# Patient Record
Sex: Female | Born: 1975 | Race: White | Hispanic: No | Marital: Married
Health system: Southern US, Community
[De-identification: ages and names within clinical notes are randomized; demographics above are authoritative.]

---

## 2000-01-17 ENCOUNTER — Other Ambulatory Visit: Admission: RE | Admit: 2000-01-17 | Discharge: 2000-01-17 | Payer: Self-pay | Admitting: *Deleted

## 2001-01-19 ENCOUNTER — Encounter: Payer: Self-pay | Admitting: Emergency Medicine

## 2001-01-19 ENCOUNTER — Emergency Department (HOSPITAL_COMMUNITY): Admission: EM | Admit: 2001-01-19 | Discharge: 2001-01-19 | Payer: Self-pay | Admitting: Emergency Medicine

## 2001-02-13 ENCOUNTER — Other Ambulatory Visit: Admission: RE | Admit: 2001-02-13 | Discharge: 2001-02-13 | Payer: Self-pay | Admitting: *Deleted

## 2004-02-04 ENCOUNTER — Encounter: Admission: RE | Admit: 2004-02-04 | Discharge: 2004-02-04 | Payer: Self-pay | Admitting: Family Medicine

## 2005-03-07 ENCOUNTER — Other Ambulatory Visit: Admission: RE | Admit: 2005-03-07 | Discharge: 2005-03-07 | Payer: Self-pay | Admitting: Obstetrics and Gynecology

## 2006-05-02 ENCOUNTER — Other Ambulatory Visit: Admission: RE | Admit: 2006-05-02 | Discharge: 2006-05-02 | Payer: Self-pay | Admitting: Obstetrics and Gynecology

## 2009-08-31 ENCOUNTER — Encounter: Admission: RE | Admit: 2009-08-31 | Discharge: 2009-08-31 | Payer: Self-pay | Admitting: Endocrinology

## 2009-09-16 ENCOUNTER — Encounter: Admission: RE | Admit: 2009-09-16 | Discharge: 2009-09-16 | Payer: Self-pay | Admitting: Endocrinology

## 2009-09-16 ENCOUNTER — Other Ambulatory Visit: Admission: RE | Admit: 2009-09-16 | Discharge: 2009-09-16 | Payer: Self-pay | Admitting: Interventional Radiology

## 2009-11-05 ENCOUNTER — Ambulatory Visit (HOSPITAL_COMMUNITY): Admission: RE | Admit: 2009-11-05 | Discharge: 2009-11-06 | Payer: Self-pay | Admitting: General Surgery

## 2009-11-05 ENCOUNTER — Encounter (INDEPENDENT_AMBULATORY_CARE_PROVIDER_SITE_OTHER): Payer: Self-pay | Admitting: General Surgery

## 2010-08-24 ENCOUNTER — Other Ambulatory Visit: Admission: RE | Admit: 2010-08-24 | Discharge: 2010-08-24 | Payer: Self-pay | Admitting: Obstetrics and Gynecology

## 2010-11-16 ENCOUNTER — Other Ambulatory Visit: Payer: Self-pay | Admitting: Nurse Practitioner

## 2010-12-23 LAB — BASIC METABOLIC PANEL
BUN: 17 mg/dL (ref 6–23)
CO2: 30 mEq/L (ref 19–32)
Calcium: 9.3 mg/dL (ref 8.4–10.5)
Chloride: 105 mEq/L (ref 96–112)
Creatinine, Ser: 0.77 mg/dL (ref 0.4–1.2)
GFR calc Af Amer: >60 mL/min (ref >60)
GFR calc non Af Amer: >60 mL/min (ref >60)
Glucose, Bld: 84 mg/dL (ref 70–99)
Potassium: 3.9 mEq/L (ref 3.5–5.1)
Sodium: 140 mEq/L (ref 135–145)

## 2010-12-23 LAB — CBC
HCT: 42 % (ref 36.0–46.0)
Hemoglobin: 14.8 g/dL (ref 12.0–15.0)
MCHC: 35.2 g/dL (ref 30.0–36.0)
MCV: 90.7 fL (ref 78.0–100.0)
Platelets: 173 10*3/uL (ref 150–400)
RBC: 4.63 MIL/uL (ref 3.87–5.11)
RDW: 12.2 % (ref 11.5–15.5)
WBC: 4.3 10*3/uL (ref 4.0–10.5)

## 2010-12-23 LAB — DIFFERENTIAL
Basophils Absolute: 0 10*3/uL (ref 0.0–0.1)
Basophils Relative: 1 % (ref 0–1)
Eosinophils Absolute: 0.1 10*3/uL (ref 0.0–0.7)
Eosinophils Relative: 2 % (ref 0–5)
Lymphocytes Relative: 26 % (ref 12–46)
Lymphs Abs: 1.1 10*3/uL (ref 0.7–4.0)
Monocytes Absolute: 0.4 10*3/uL (ref 0.1–1.0)
Monocytes Relative: 9 % (ref 3–12)
Neutro Abs: 2.7 10*3/uL (ref 1.7–7.7)
Neutrophils Relative %: 62 % (ref 43–77)

## 2010-12-23 LAB — PREGNANCY, URINE: Preg Test, Ur: NEGATIVE

## 2011-02-28 IMAGING — US US SOFT TISSUE HEAD/NECK
1 series · 14 of 25 positions shown · non-contrast
Comparison: None

CLINICAL DATA: Enlarged thyroid, recently started thyroid
medication

THYROID ULTRASOUND
TECHNIQUE: Ultrasound examination of the thyroid gland and
adjacent soft tissues was performed.

[Series 1: us soft tissue head/neck · 0.10mm/px · 14 of 67 slices shown]
[im 1/67]
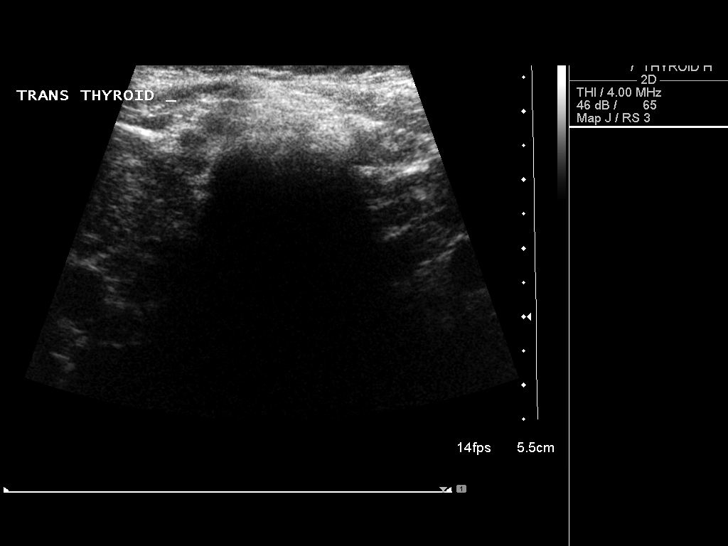
[im 6/67]
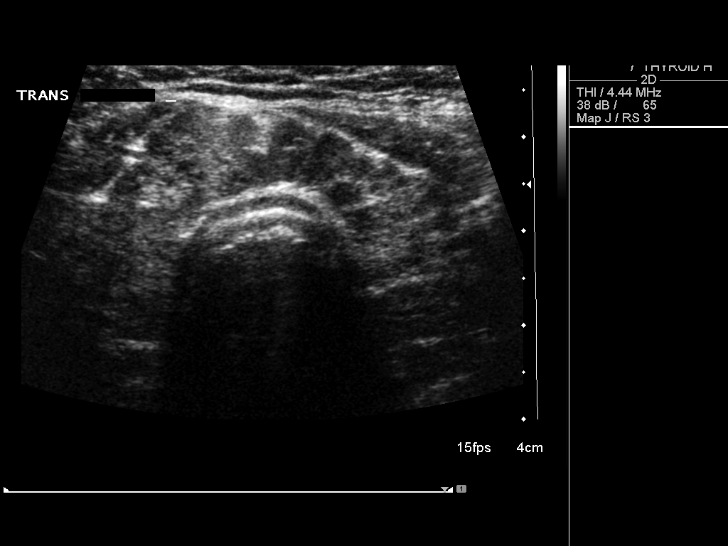
[im 12/67]
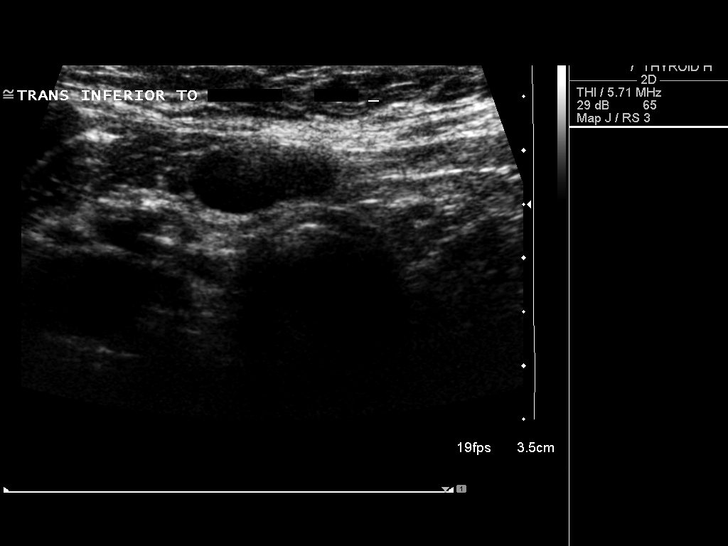
[im 17/67]
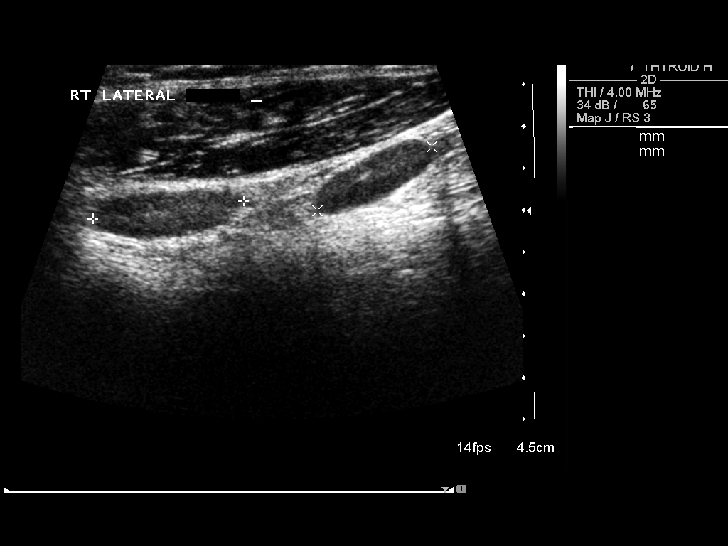
[im 23/67]
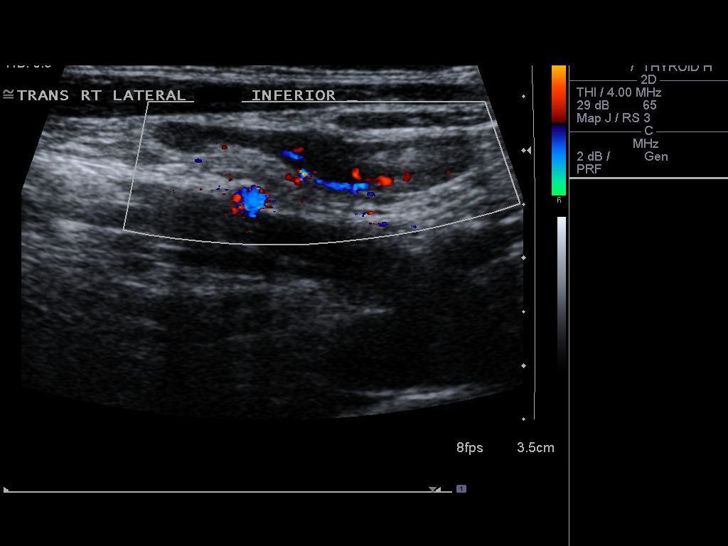
[im 25/67]
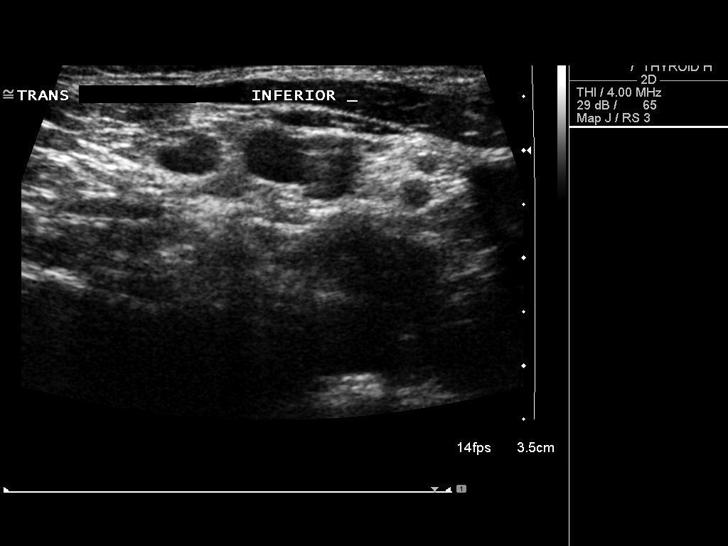
[im 31/67]
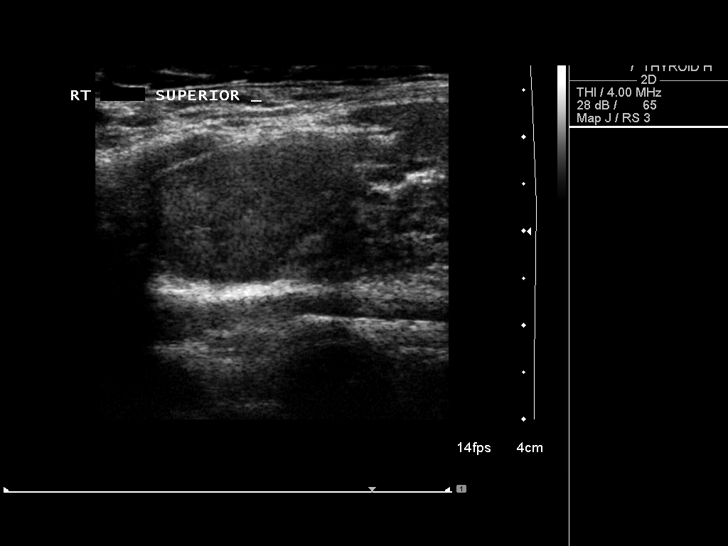
[im 36/67]
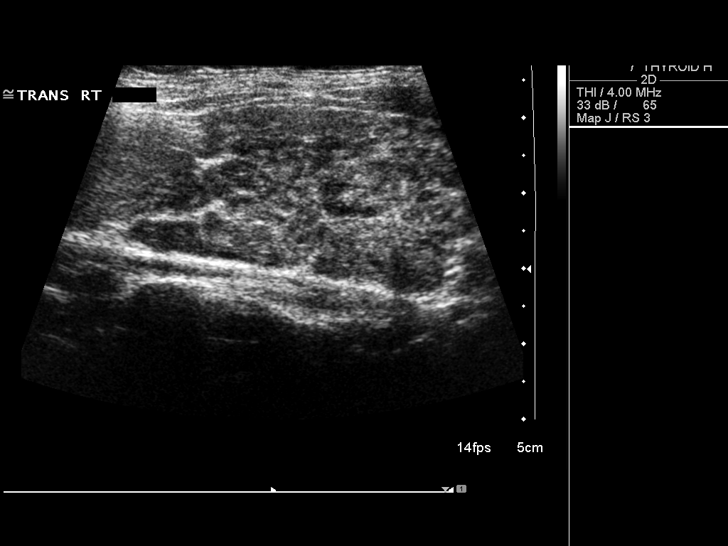
[im 42/67]
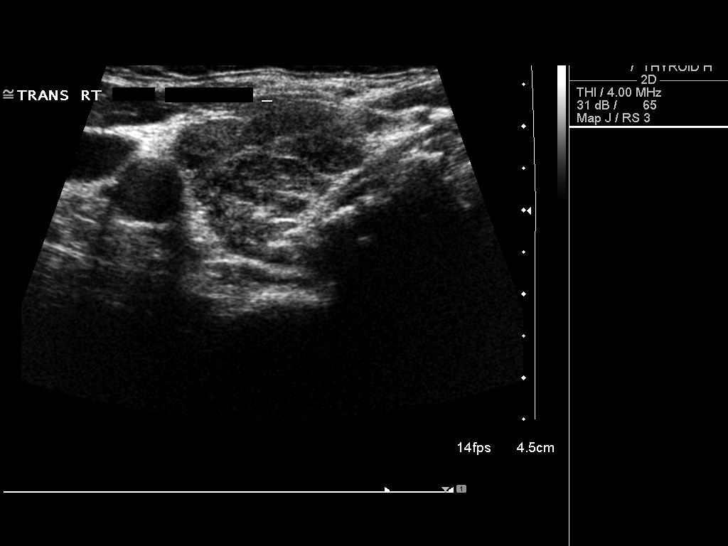
[im 45/67]
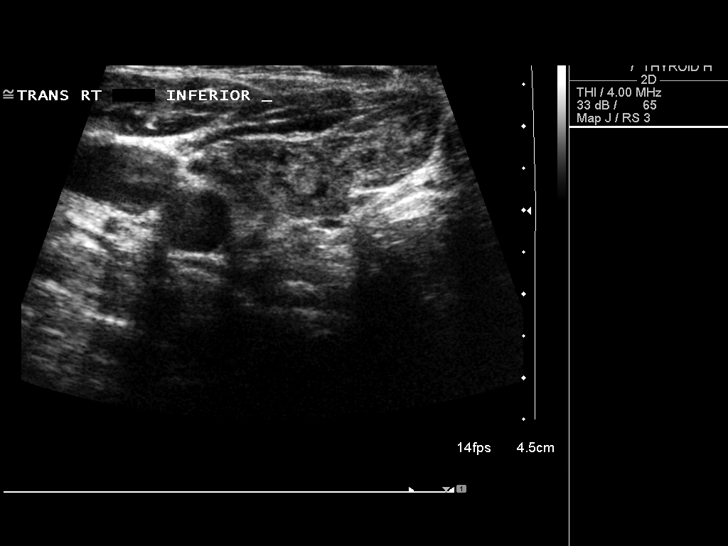
[im 50/67]
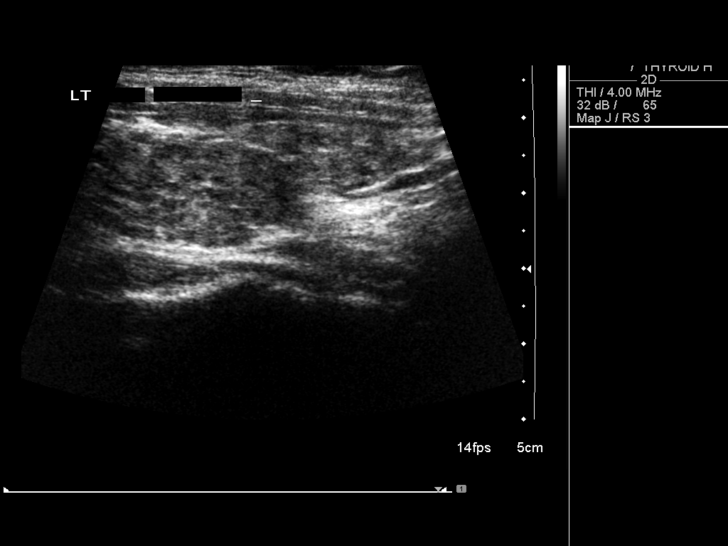
[im 56/67]
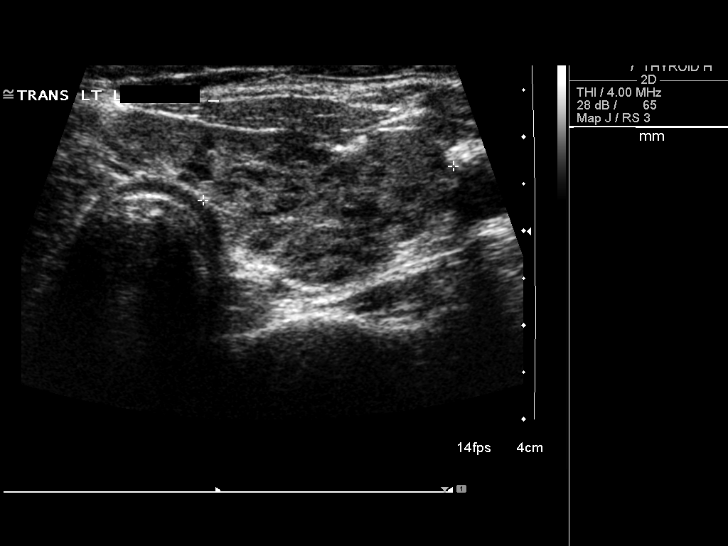
[im 61/67]
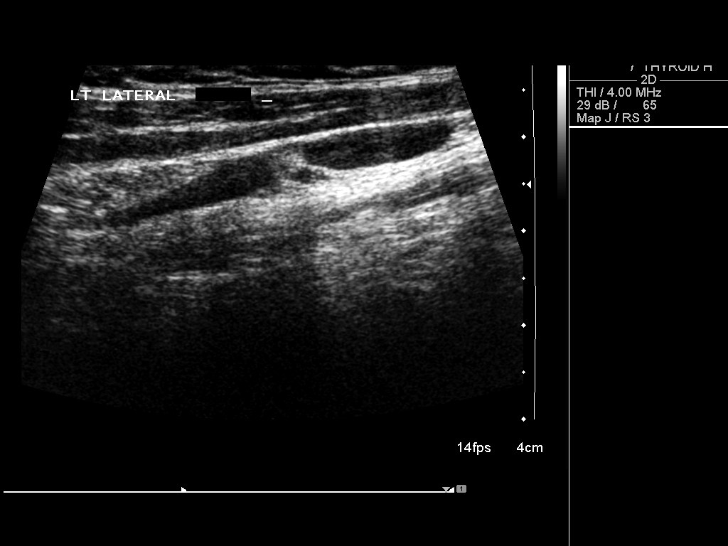
[im 67/67]
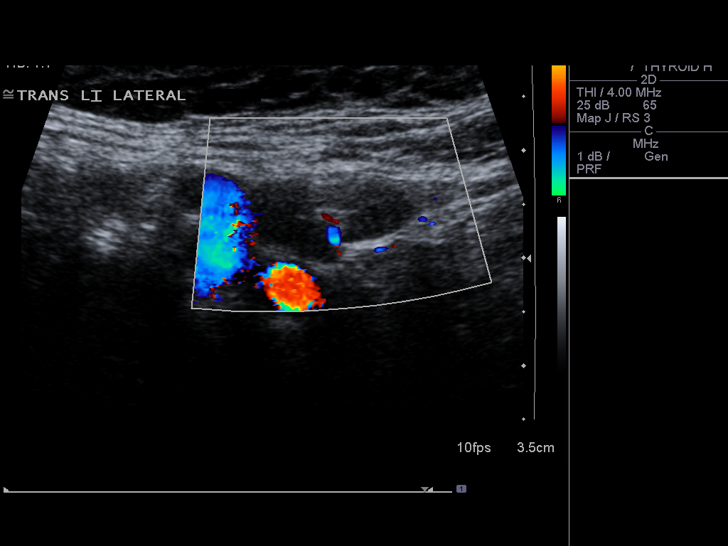

[14 of 25 positions shown; findings below may reference images not displayed]

FINDINGS: The thyroid gland is diffusely enlarged and
inhomogeneous.  The right lobe measures 7.1 cm sagittally, with a
depth of 2.3 cm and width of 3.0 cm.  The left lobe measures 7.1 x
1.8 x 2.8 cm, with the isthmus thickened at 10.3 mm.  A solid
nodule is noted in the upper pole of the right lobe of 2.2 x 1.3 x
1.7 cm.  The remainder of the thyroid gland is very inhomogeneous
without discrete measurable nodules.  There are slightly prominent
lymph nodes bilaterally.
IMPRESSION: Enlarged and inhomogeneous thyroid with a solid nodule in the upper
pole on the right of 2.2 cm.

## 2011-03-16 IMAGING — US US BIOPSY
1 series · 4 of 4 positions shown · non-contrast
Comparison: none

CLINICAL DATA: Inhomogeneous gland with dominant right nodule.

ULTRASOUND-GUIDED THYROID ASPIRATION BIOPSY
TECHNIQUE: Survey ultrasound was performed and the dominant lesion
in the superior pole right lobe lobe was localized.  An appropriate
skin entry site was determined.  Skin was marked, then prepped with
Betadine, draped in usual sterile fashion, and infiltrated locally
with 1% lidocaine.  Under real-time ultrasound guidance,   three
passes were made into the lesion with 25 gauge needles.  The
patient tolerated procedure well, with no immediate complications.
IMPRESSION
1.  Technically successful ultrasound-guided thyroid aspiration
biopsy

[Series 1: us biopsy · 0.08mm/px · 4 of 4 slices shown]
[im 1/4]
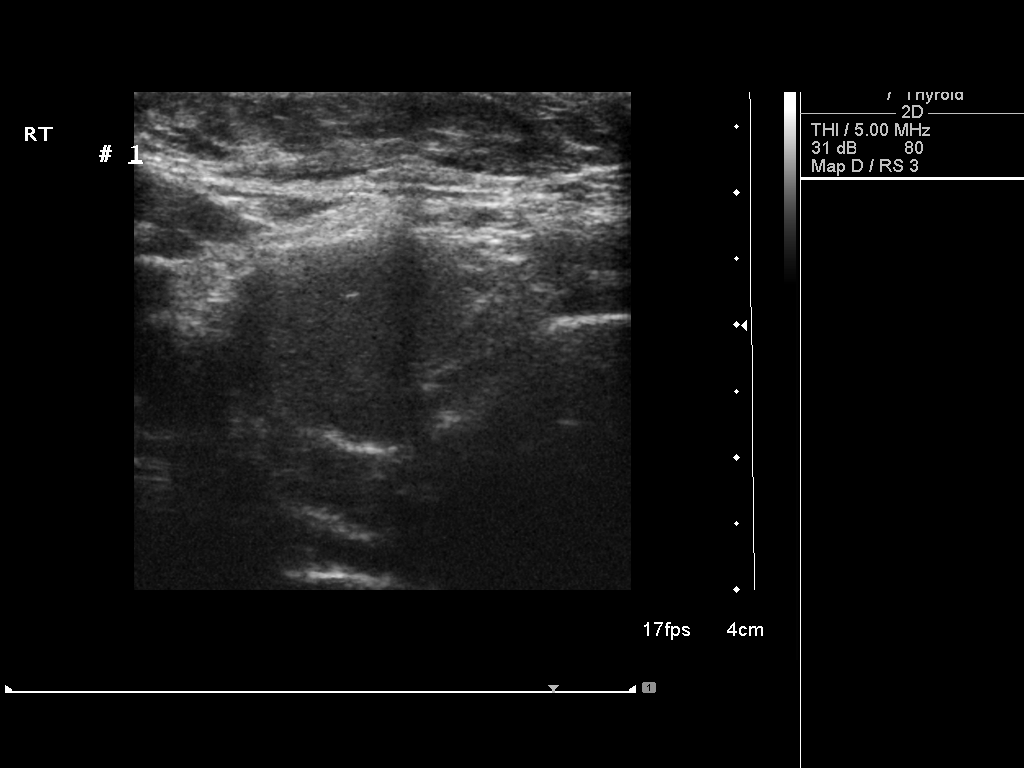
[im 2/4]
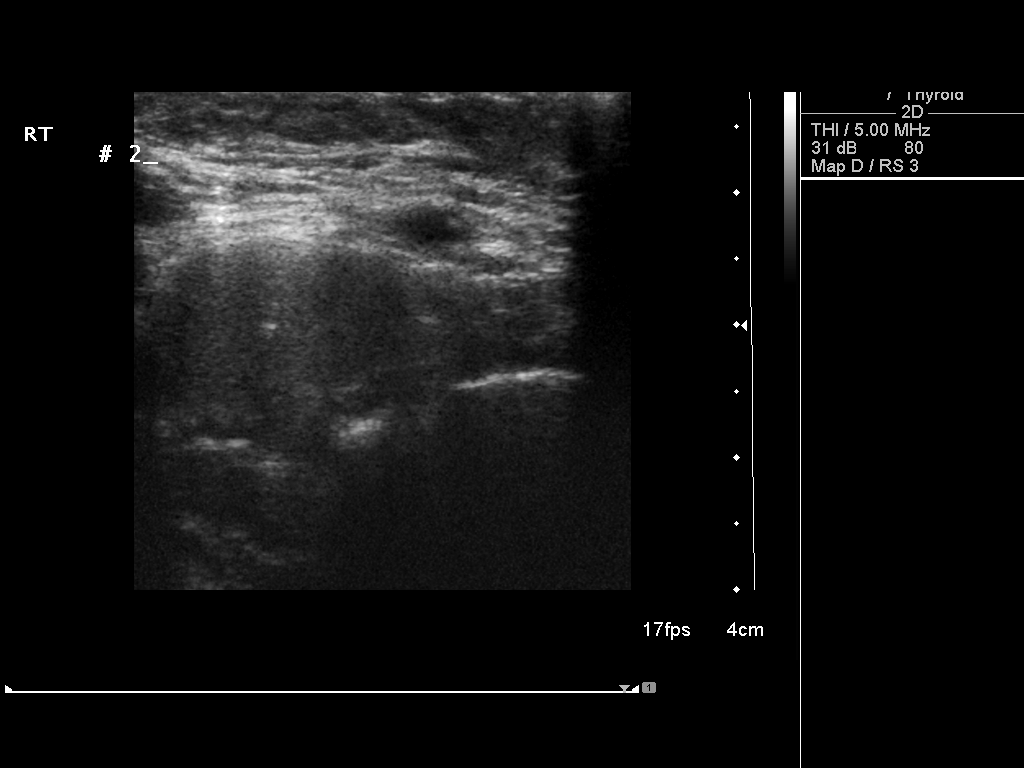
[im 3/4]
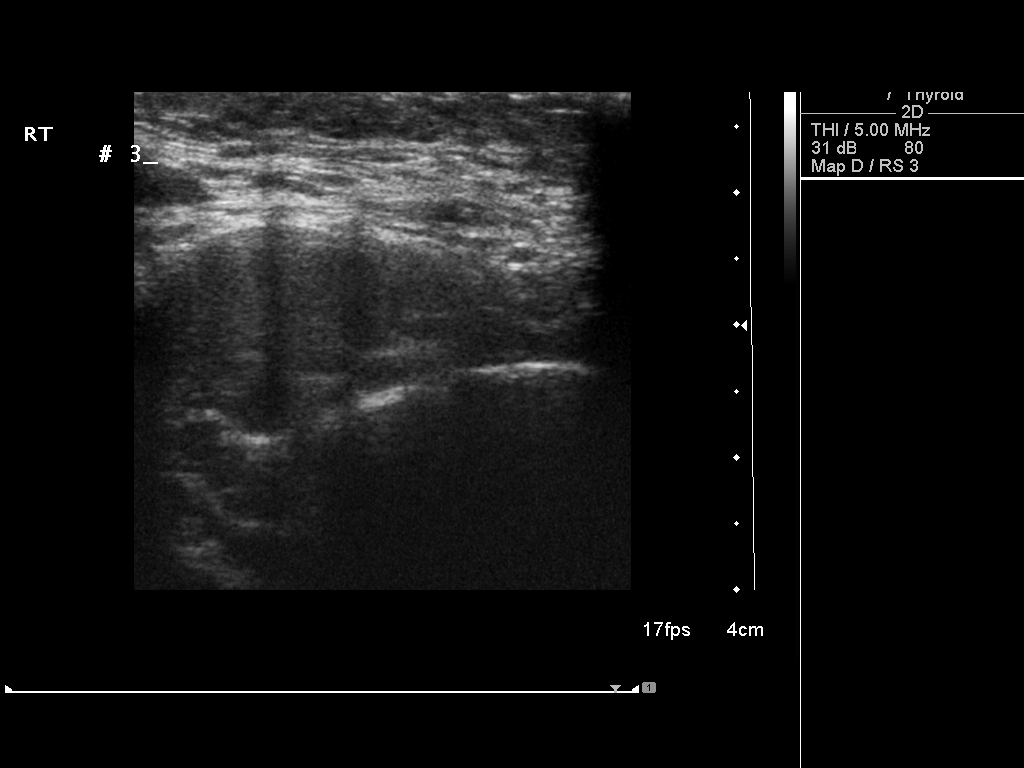
[im 4/4]
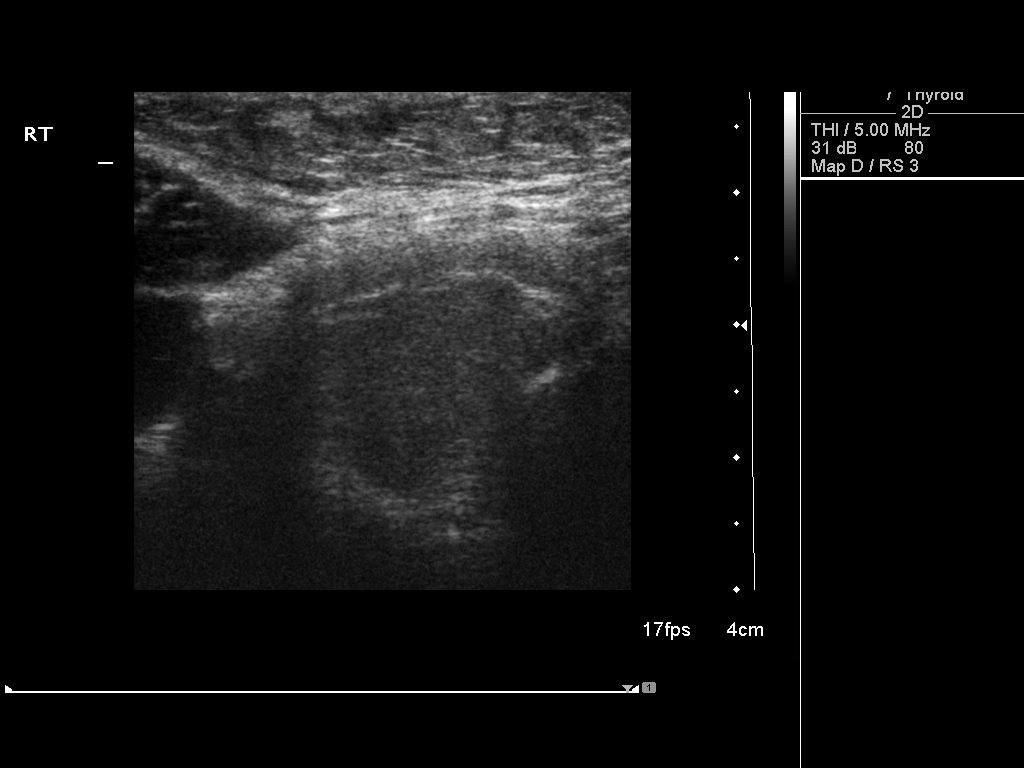

[4 of 4 positions shown; findings below may reference images not displayed]

## 2011-08-12 ENCOUNTER — Telehealth (INDEPENDENT_AMBULATORY_CARE_PROVIDER_SITE_OTHER): Payer: Self-pay | Admitting: General Surgery

## 2011-08-12 NOTE — Telephone Encounter (Signed)
Patient called and left voicemail checking on referral to Dr Sharl Ma with Endocrinology

## 2011-08-12 NOTE — Telephone Encounter (Signed)
I called patient back and left a message. It looked like patient has never seen Dr Andrey Campanile. Patient saw Dr Freida Busman in the past. Made her aware Dr Freida Busman no longer works here. Saw that she had been seeing Dr Talmage Nap. If patient wants to switch doctors she shouldn't need a referral. If she does I advised patient to contact PCP. To call back if has any questions.

## 2013-06-11 ENCOUNTER — Other Ambulatory Visit (HOSPITAL_COMMUNITY)
Admission: RE | Admit: 2013-06-11 | Discharge: 2013-06-11 | Disposition: A | Discharge status: Home / Self Care | Payer: 59 | Source: Ambulatory Visit | Attending: Obstetrics and Gynecology | Admitting: Obstetrics and Gynecology

## 2013-06-11 ENCOUNTER — Other Ambulatory Visit: Payer: Self-pay | Admitting: Nurse Practitioner

## 2013-06-11 DIAGNOSIS — Z01419 Encounter for gynecological examination (general) (routine) without abnormal findings: Secondary | ICD-10-CM | POA: Insufficient documentation

## 2013-06-11 DIAGNOSIS — Z1151 Encounter for screening for human papillomavirus (HPV): Secondary | ICD-10-CM | POA: Insufficient documentation

## 2015-06-25 ENCOUNTER — Other Ambulatory Visit: Payer: Self-pay | Admitting: Nurse Practitioner

## 2015-06-25 ENCOUNTER — Other Ambulatory Visit (HOSPITAL_COMMUNITY)
Admission: RE | Admit: 2015-06-25 | Discharge: 2015-06-25 | Disposition: A | Discharge status: Home / Self Care | Payer: 59 | Source: Ambulatory Visit | Attending: Nurse Practitioner | Admitting: Nurse Practitioner

## 2015-06-25 DIAGNOSIS — Z1151 Encounter for screening for human papillomavirus (HPV): Secondary | ICD-10-CM | POA: Insufficient documentation

## 2015-06-25 DIAGNOSIS — Z01419 Encounter for gynecological examination (general) (routine) without abnormal findings: Secondary | ICD-10-CM | POA: Diagnosis present

## 2015-06-29 LAB — CYTOLOGY - PAP

## 2016-06-20 ENCOUNTER — Other Ambulatory Visit: Payer: Self-pay | Admitting: Physician Assistant

## 2016-06-20 DIAGNOSIS — M799 Soft tissue disorder, unspecified: Secondary | ICD-10-CM

## 2016-06-30 ENCOUNTER — Ambulatory Visit
Admission: RE | Admit: 2016-06-30 | Discharge: 2016-06-30 | Disposition: A | Discharge status: Home / Self Care | Payer: 59 | Source: Ambulatory Visit | Attending: Physician Assistant | Admitting: Physician Assistant

## 2016-06-30 DIAGNOSIS — M799 Soft tissue disorder, unspecified: Secondary | ICD-10-CM

## 2016-11-18 DIAGNOSIS — Z23 Encounter for immunization: Secondary | ICD-10-CM | POA: Diagnosis not present

## 2016-11-25 DIAGNOSIS — Z7189 Other specified counseling: Secondary | ICD-10-CM | POA: Diagnosis not present

## 2016-11-25 DIAGNOSIS — Z23 Encounter for immunization: Secondary | ICD-10-CM | POA: Diagnosis not present

## 2017-03-13 DIAGNOSIS — N951 Menopausal and female climacteric states: Secondary | ICD-10-CM | POA: Diagnosis not present

## 2017-03-14 DIAGNOSIS — E782 Mixed hyperlipidemia: Secondary | ICD-10-CM | POA: Diagnosis not present

## 2017-03-14 DIAGNOSIS — E039 Hypothyroidism, unspecified: Secondary | ICD-10-CM | POA: Diagnosis not present

## 2017-11-13 DIAGNOSIS — Z1231 Encounter for screening mammogram for malignant neoplasm of breast: Secondary | ICD-10-CM | POA: Diagnosis not present

## 2018-01-25 DIAGNOSIS — E039 Hypothyroidism, unspecified: Secondary | ICD-10-CM | POA: Diagnosis not present

## 2018-01-25 DIAGNOSIS — E785 Hyperlipidemia, unspecified: Secondary | ICD-10-CM | POA: Diagnosis not present

## 2018-01-25 DIAGNOSIS — Z0000 Encounter for general adult medical examination without abnormal findings: Secondary | ICD-10-CM | POA: Diagnosis not present

## 2018-03-19 DIAGNOSIS — E785 Hyperlipidemia, unspecified: Secondary | ICD-10-CM | POA: Diagnosis not present

## 2018-03-19 DIAGNOSIS — Z79899 Other long term (current) drug therapy: Secondary | ICD-10-CM | POA: Diagnosis not present

## 2018-03-19 DIAGNOSIS — E039 Hypothyroidism, unspecified: Secondary | ICD-10-CM | POA: Diagnosis not present

## 2018-07-27 DIAGNOSIS — E785 Hyperlipidemia, unspecified: Secondary | ICD-10-CM | POA: Diagnosis not present

## 2018-07-27 DIAGNOSIS — R200 Anesthesia of skin: Secondary | ICD-10-CM | POA: Diagnosis not present

## 2018-07-27 DIAGNOSIS — E039 Hypothyroidism, unspecified: Secondary | ICD-10-CM | POA: Diagnosis not present

## 2018-10-10 DIAGNOSIS — E785 Hyperlipidemia, unspecified: Secondary | ICD-10-CM | POA: Diagnosis not present

## 2018-11-19 DIAGNOSIS — Z1231 Encounter for screening mammogram for malignant neoplasm of breast: Secondary | ICD-10-CM | POA: Diagnosis not present

## 2019-01-22 DIAGNOSIS — E785 Hyperlipidemia, unspecified: Secondary | ICD-10-CM | POA: Diagnosis not present

## 2019-01-22 DIAGNOSIS — E039 Hypothyroidism, unspecified: Secondary | ICD-10-CM | POA: Diagnosis not present

## 2019-06-13 ENCOUNTER — Other Ambulatory Visit: Payer: Self-pay

## 2019-06-13 DIAGNOSIS — Z20822 Contact with and (suspected) exposure to covid-19: Secondary | ICD-10-CM

## 2019-06-14 LAB — NOVEL CORONAVIRUS, NAA: SARS-CoV-2, NAA: NOT DETECTED

## 2019-07-25 ENCOUNTER — Other Ambulatory Visit: Payer: Self-pay

## 2019-07-25 DIAGNOSIS — Z20822 Contact with and (suspected) exposure to covid-19: Secondary | ICD-10-CM

## 2019-07-27 LAB — NOVEL CORONAVIRUS, NAA: SARS-CoV-2, NAA: NOT DETECTED

## 2019-09-18 ENCOUNTER — Other Ambulatory Visit: Payer: Self-pay

## 2019-09-18 ENCOUNTER — Ambulatory Visit: Payer: 59 | Attending: Internal Medicine

## 2019-09-18 ENCOUNTER — Other Ambulatory Visit: Payer: 59

## 2019-09-18 DIAGNOSIS — Z20822 Contact with and (suspected) exposure to covid-19: Secondary | ICD-10-CM

## 2019-09-20 ENCOUNTER — Ambulatory Visit: Payer: 59 | Attending: Internal Medicine

## 2019-09-20 DIAGNOSIS — Z20822 Contact with and (suspected) exposure to covid-19: Secondary | ICD-10-CM

## 2019-09-20 LAB — NOVEL CORONAVIRUS, NAA: SARS-CoV-2, NAA: NOT DETECTED

## 2019-09-21 LAB — NOVEL CORONAVIRUS, NAA: SARS-CoV-2, NAA: NOT DETECTED

## 2019-09-30 ENCOUNTER — Ambulatory Visit: Payer: 59 | Attending: Internal Medicine

## 2019-09-30 DIAGNOSIS — Z20822 Contact with and (suspected) exposure to covid-19: Secondary | ICD-10-CM

## 2019-10-02 LAB — NOVEL CORONAVIRUS, NAA: SARS-CoV-2, NAA: NOT DETECTED

## 2019-11-06 ENCOUNTER — Ambulatory Visit: Payer: 59 | Attending: Internal Medicine

## 2019-11-06 DIAGNOSIS — Z20822 Contact with and (suspected) exposure to covid-19: Secondary | ICD-10-CM

## 2019-11-08 LAB — NOVEL CORONAVIRUS, NAA: SARS-CoV-2, NAA: NOT DETECTED

## 2020-06-23 ENCOUNTER — Other Ambulatory Visit: Payer: 59

## 2020-06-23 DIAGNOSIS — Z20822 Contact with and (suspected) exposure to covid-19: Secondary | ICD-10-CM

## 2020-06-25 LAB — NOVEL CORONAVIRUS, NAA: SARS-CoV-2, NAA: NOT DETECTED

## 2020-06-25 LAB — SARS-COV-2, NAA 2 DAY TAT

## 2020-07-24 ENCOUNTER — Ambulatory Visit: Payer: 59 | Attending: Internal Medicine

## 2020-07-24 DIAGNOSIS — Z23 Encounter for immunization: Secondary | ICD-10-CM

## 2020-07-24 NOTE — Progress Notes (Signed)
   Cov[facility] Vaccination [facility]  Name:  Diane Foster    MRN: [id number] DOB: [birth year]  07/24/2020  [name] was observed post Covid-19 immunization for 15 minutes without incident. She was provided with Vaccine Information Sheet and instruction to access the V-Safe system.   [name] was instructed to call 911 with any severe reactions post vaccine: Marland Kitchen Difficulty breathing  . Swelling of face and throat  . A fast heartbeat  . A bad rash all over body  . Dizziness and weakness

## 2020-08-08 ENCOUNTER — Other Ambulatory Visit: Payer: 59

## 2020-09-17 ENCOUNTER — Other Ambulatory Visit: Payer: Self-pay

## 2020-09-17 ENCOUNTER — Other Ambulatory Visit: Payer: 59

## 2020-09-17 DIAGNOSIS — Z20822 Contact with and (suspected) exposure to covid-19: Secondary | ICD-10-CM

## 2020-09-18 LAB — SARS-COV-2, NAA 2 DAY TAT

## 2020-09-18 LAB — NOVEL CORONAVIRUS, NAA: SARS-CoV-2, NAA: NOT DETECTED

## 2020-10-07 ENCOUNTER — Other Ambulatory Visit: Payer: 59

## 2020-10-07 DIAGNOSIS — Z20822 Contact with and (suspected) exposure to covid-19: Secondary | ICD-10-CM

## 2020-10-08 LAB — NOVEL CORONAVIRUS, NAA: SARS-CoV-2, NAA: NOT DETECTED

## 2020-10-08 LAB — SARS-COV-2, NAA 2 DAY TAT

## 2023-03-31 ENCOUNTER — Ambulatory Visit (HOSPITAL_BASED_OUTPATIENT_CLINIC_OR_DEPARTMENT_OTHER)
Admission: RE | Admit: 2023-03-31 | Discharge: 2023-03-31 | Disposition: A | Discharge status: Home / Self Care | Payer: 59 | Source: Ambulatory Visit | Attending: Medical | Admitting: Medical

## 2023-03-31 ENCOUNTER — Other Ambulatory Visit (HOSPITAL_BASED_OUTPATIENT_CLINIC_OR_DEPARTMENT_OTHER): Payer: Self-pay | Admitting: Medical

## 2023-03-31 DIAGNOSIS — M79605 Pain in left leg: Secondary | ICD-10-CM | POA: Insufficient documentation
